# Patient Record
Sex: Female | Born: 1990 | Race: Black or African American | Hispanic: No | Marital: Single | Smoking: Never smoker
Health system: Southern US, Community
[De-identification: ages and names within clinical notes are randomized; demographics above are authoritative.]

## PROBLEM LIST (undated history)

## (undated) DIAGNOSIS — D649 Anemia, unspecified: Secondary | ICD-10-CM

## (undated) DIAGNOSIS — J45909 Unspecified asthma, uncomplicated: Secondary | ICD-10-CM

## (undated) HISTORY — DX: Anemia, unspecified: D64.9

## (undated) HISTORY — DX: Unspecified asthma, uncomplicated: J45.909

---

## 2020-07-24 ENCOUNTER — Other Ambulatory Visit: Payer: Self-pay

## 2020-07-24 ENCOUNTER — Ambulatory Visit (INDEPENDENT_AMBULATORY_CARE_PROVIDER_SITE_OTHER): Payer: Medicaid Other

## 2020-07-24 DIAGNOSIS — Z3490 Encounter for supervision of normal pregnancy, unspecified, unspecified trimester: Secondary | ICD-10-CM

## 2020-07-24 DIAGNOSIS — N912 Amenorrhea, unspecified: Secondary | ICD-10-CM

## 2020-07-24 DIAGNOSIS — O219 Vomiting of pregnancy, unspecified: Secondary | ICD-10-CM

## 2020-07-24 LAB — POCT URINE PREGNANCY: Preg Test, Ur: POSITIVE — AB

## 2020-07-24 MED ORDER — BLOOD PRESSURE MONITOR KIT: 1.0000 | PACK | 0 refills | Status: AC

## 2020-07-24 MED ORDER — PROMETHAZINE HCL 25 MG PO TABS: 25.0000 mg | ORAL_TABLET | Freq: Four times a day (QID) | ORAL | 0 refills | Status: AC | PRN

## 2020-07-24 NOTE — Progress Notes (Signed)
Ms. Faiella presents today for UPT. She has no unusual complaints of lower pelvic pain and Nausea medication requested.  LMP: 06/02/20    OBJECTIVE: Appears well, in no apparent distress.  OB History   No obstetric history on file.    Home UPT Result:Positive  In-Office UPT result: Positive  I have reviewed the patient's medical, obstetrical, social, and family histories, and medications.   ASSESSMENT: Positive pregnancy test  PLAN Prenatal care to be completed at: [facility]  NOB Intake Completed.  Nausea Rx sent per Protocol. B/P Cuff ordered.  Patient advised any bleeding or increased pain to report to the hospital for an evaluation. Patient agreeable and voiced understanding.

## 2020-07-25 NOTE — Progress Notes (Signed)
Patient was assessed and managed by nursing staff during this encounter. I have reviewed the chart and agree with the documentation and plan. I have also made any necessary editorial changes.  Daijha Leggio, MD 07/25/2020 8:14 AM 

## 2020-08-13 ENCOUNTER — Ambulatory Visit (INDEPENDENT_AMBULATORY_CARE_PROVIDER_SITE_OTHER): Payer: Medicaid Other

## 2020-08-13 ENCOUNTER — Other Ambulatory Visit: Payer: Self-pay

## 2020-08-13 ENCOUNTER — Ambulatory Visit: Payer: Medicaid Other

## 2020-08-13 VITALS — BP 132/58 | HR 82 | Ht 64.0 in | Wt 116.6 lb

## 2020-08-13 DIAGNOSIS — Z789 Other specified health status: Secondary | ICD-10-CM

## 2020-08-13 DIAGNOSIS — O3680X0 Pregnancy with inconclusive fetal viability, not applicable or unspecified: Secondary | ICD-10-CM

## 2020-08-13 NOTE — Addendum Note (Signed)
Addended by: Hamilton Capri on: 08/13/2020 04:08 PM   Modules accepted: Orders

## 2020-08-13 NOTE — Progress Notes (Signed)
Patient presents today for a dating and viability u/s. U/S today reveals a gestational sac with a possible yolk sac measuring [redacted]w[redacted]d. These findings are not consistent with LMP of 06/02/20. Discussed findings with Dr. Gerri Spore who states to have patient return in one week to see a provider and to have a repeat scan.  Pain and bleeding precautions reviewed with patient and when to be evaluated. Patient verbalized understanding.

## 2020-08-13 NOTE — Addendum Note (Signed)
Addended by: Hamilton Capri on: 08/13/2020 03:54 PM   Modules accepted: Orders

## 2020-08-14 NOTE — Progress Notes (Signed)
Patient was assessed and managed by nursing staff during this encounter. I have reviewed the chart and agree with the documentation and plan. I have also made any necessary editorial changes.  Camrin Lapre, MD 08/14/2020 9:36 AM 

## 2020-08-20 ENCOUNTER — Other Ambulatory Visit: Payer: Self-pay

## 2020-08-20 NOTE — Telephone Encounter (Signed)
Patient called wanting to discuss medication management for her SAB. She was seen at [facility] over the weekend. She states that her bleeding has increased. She is having mild to moderate bleeding at this time. She states that her cramping is intense, but she was given pain medication for that.   Discussed with Dr. Gerri Spore who states that patient needs to be seen here in the office. Patient is scheduled for in person visit on 9/10 with Dr. Donavan Foil.   Reviewed bleeding and pain measurements with patient. Patient advised to go to the ED if she starts soaking through a pad every 1-2 hours, severe cramping not controlled with pain medication. Patient verbalized understanding.

## 2020-08-22 ENCOUNTER — Ambulatory Visit: Admission: RE | Admit: 2020-08-22 | Payer: Medicaid Other | Source: Ambulatory Visit

## 2020-08-22 ENCOUNTER — Encounter: Payer: Medicaid Other | Admitting: Obstetrics and Gynecology

## 2020-08-23 ENCOUNTER — Telehealth: Payer: Medicaid Other | Admitting: Obstetrics and Gynecology

## 2020-08-29 ENCOUNTER — Other Ambulatory Visit: Payer: Medicaid Other
